# Patient Record
Sex: Female | Born: 1965 | Race: White | Hispanic: No | Marital: Single | Smoking: Current every day smoker
Health system: Southern US, Community
[De-identification: ages and names within clinical notes are randomized; demographics above are authoritative.]

## PROBLEM LIST (undated history)

## (undated) HISTORY — PX: BREAST EXCISIONAL BIOPSY: SUR124

---

## 2008-05-12 ENCOUNTER — Emergency Department (HOSPITAL_BASED_OUTPATIENT_CLINIC_OR_DEPARTMENT_OTHER): Admission: EM | Admit: 2008-05-12 | Discharge: 2008-05-12 | Payer: Self-pay | Admitting: Emergency Medicine

## 2009-01-13 ENCOUNTER — Encounter: Payer: Self-pay | Admitting: Emergency Medicine

## 2009-01-13 ENCOUNTER — Ambulatory Visit: Payer: Self-pay | Admitting: Cardiothoracic Surgery

## 2009-01-13 ENCOUNTER — Ambulatory Visit: Payer: Self-pay | Admitting: Diagnostic Radiology

## 2009-01-13 ENCOUNTER — Inpatient Hospital Stay (HOSPITAL_COMMUNITY): Admission: EM | Admit: 2009-01-13 | Discharge: 2009-01-15 | Payer: Self-pay

## 2009-11-08 ENCOUNTER — Emergency Department (HOSPITAL_BASED_OUTPATIENT_CLINIC_OR_DEPARTMENT_OTHER): Admission: EM | Admit: 2009-11-08 | Discharge: 2009-11-08 | Payer: Self-pay | Admitting: Emergency Medicine

## 2009-11-10 ENCOUNTER — Emergency Department (HOSPITAL_BASED_OUTPATIENT_CLINIC_OR_DEPARTMENT_OTHER): Admission: EM | Admit: 2009-11-10 | Discharge: 2009-11-10 | Payer: Self-pay | Admitting: Emergency Medicine

## 2009-11-10 ENCOUNTER — Ambulatory Visit: Payer: Self-pay | Admitting: Diagnostic Radiology

## 2010-10-30 ENCOUNTER — Ambulatory Visit: Payer: Self-pay | Admitting: Family Medicine

## 2010-12-09 LAB — DIFFERENTIAL
Basophils Absolute: 0 10*3/uL (ref 0.0–0.1)
Basophils Relative: 0 % (ref 0–1)
Eosinophils Absolute: 0.2 10*3/uL (ref 0.0–0.7)
Lymphocytes Relative: 14 % (ref 12–46)
Neutro Abs: 11.4 10*3/uL — ABNORMAL HIGH (ref 1.7–7.7)
Neutrophils Relative %: 76 % (ref 43–77)

## 2010-12-09 LAB — BASIC METABOLIC PANEL
CO2: 31 mEq/L (ref 19–32)
Calcium: 8.3 mg/dL — ABNORMAL LOW (ref 8.4–10.5)
Chloride: 104 mEq/L (ref 96–112)
Creatinine, Ser: 0.7 mg/dL (ref 0.4–1.2)
Glucose, Bld: 92 mg/dL (ref 70–99)
Potassium: 3.7 mEq/L (ref 3.5–5.1)

## 2010-12-09 LAB — CBC
HCT: 23.6 % — ABNORMAL LOW (ref 36.0–46.0)
Hemoglobin: 7.4 g/dL — ABNORMAL LOW (ref 12.0–15.0)
Platelets: 464 10*3/uL — ABNORMAL HIGH (ref 150–400)
RBC: 3.79 MIL/uL — ABNORMAL LOW (ref 3.87–5.11)
RDW: 17.7 % — ABNORMAL HIGH (ref 11.5–15.5)

## 2010-12-30 LAB — IRON AND TIBC
Iron: 12 ug/dL — ABNORMAL LOW (ref 42–135)
Saturation Ratios: 3 % — ABNORMAL LOW (ref 20–55)
TIBC: 429 ug/dL (ref 250–470)
UIBC: 417 ug/dL

## 2010-12-30 LAB — OPIATE, QUANTITATIVE, URINE: Hydrocodone: NEGATIVE ng/mL

## 2010-12-30 LAB — BASIC METABOLIC PANEL
CO2: 24 mEq/L (ref 19–32)
CO2: 24 mEq/L (ref 19–32)
Calcium: 9.4 mg/dL (ref 8.4–10.5)
Chloride: 107 mEq/L (ref 96–112)
Creatinine, Ser: 0.7 mg/dL (ref 0.4–1.2)
Glucose, Bld: 157 mg/dL — ABNORMAL HIGH (ref 70–99)
Potassium: 3.8 mEq/L (ref 3.5–5.1)
Sodium: 136 mEq/L (ref 135–145)
Sodium: 142 mEq/L (ref 135–145)

## 2010-12-30 LAB — CBC
HCT: 25.6 % — ABNORMAL LOW (ref 36.0–46.0)
HCT: 26.1 % — ABNORMAL LOW (ref 36.0–46.0)
Hemoglobin: 8.1 g/dL — ABNORMAL LOW (ref 12.0–15.0)
MCHC: 31.3 g/dL (ref 30.0–36.0)
MCHC: 31.9 g/dL (ref 30.0–36.0)
MCV: 62.7 fL — ABNORMAL LOW (ref 78.0–100.0)
MCV: 62.7 fL — ABNORMAL LOW (ref 78.0–100.0)
Platelets: 437 10*3/uL — ABNORMAL HIGH (ref 150–400)
Platelets: 453 10*3/uL — ABNORMAL HIGH (ref 150–400)
RBC: 4.08 MIL/uL (ref 3.87–5.11)
RDW: 19.5 % — ABNORMAL HIGH (ref 11.5–15.5)
RDW: 19.6 % — ABNORMAL HIGH (ref 11.5–15.5)
WBC: 6.9 10*3/uL (ref 4.0–10.5)

## 2010-12-30 LAB — POCT CARDIAC MARKERS
CKMB, poc: <1 ng/mL — ABNORMAL LOW (ref 1.0–8.0)
Myoglobin, poc: 57.5 ng/mL (ref 12–200)

## 2010-12-30 LAB — RETICULOCYTES
RBC.: 4.22 MIL/uL (ref 3.87–5.11)
Retic Count, Absolute: 54.9 10*3/uL (ref 19.0–186.0)
Retic Ct Pct: 1.3 % (ref 0.4–3.1)

## 2010-12-30 LAB — PREGNANCY, URINE: Preg Test, Ur: NEGATIVE

## 2010-12-30 LAB — LIPID PANEL
HDL: 26 mg/dL — ABNORMAL LOW (ref >39)
LDL Cholesterol: 82 mg/dL (ref 0–99)
Triglycerides: 61 mg/dL (ref <150)

## 2010-12-30 LAB — D-DIMER, QUANTITATIVE: D-Dimer, Quant: 0.73 ug/mL-FEU — ABNORMAL HIGH (ref 0.00–0.48)

## 2010-12-30 LAB — FERRITIN: Ferritin: 6 ng/mL — ABNORMAL LOW (ref 10–291)

## 2010-12-30 LAB — CARDIAC PANEL(CRET KIN+CKTOT+MB+TROPI)
CK, MB: 0.8 ng/mL (ref 0.3–4.0)
Relative Index: 0.5 (ref 0.0–2.5)
Troponin I: <0.01 ng/mL (ref 0.00–0.06)

## 2010-12-30 LAB — FOLATE: Folate: 7.5 ng/mL

## 2010-12-30 LAB — URINE DRUGS OF ABUSE SCREEN W ALC, ROUTINE (REF LAB)
Barbiturate Quant, Ur: NEGATIVE
Benzodiazepines.: NEGATIVE
Marijuana Metabolite: NEGATIVE
Phencyclidine (PCP): NEGATIVE
Propoxyphene: NEGATIVE

## 2010-12-30 LAB — VITAMIN B12: Vitamin B-12: 455 pg/mL (ref 211–911)

## 2011-02-02 NOTE — Consult Note (Signed)
[name], [name]             ACCOUNT NO.:  [id number]   MEDICAL RECORD NO.:  [id number]          PATIENT TYPE:  INP   LOCATION:  2009                         FACILITY:  [facility]   PHYSICIAN:  Sheliah Plane, MD    DATE OF BIRTH:  10-Nov-1965   DATE OF CONSULTATION:  01/14/2009  DATE OF DISCHARGE:                                 CONSULTATION   REQUESTING PHYSICIAN:  Orlene Och, MD   PRIMARY CARE PHYSICIAN:  None established yet.   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   HISTORY OF PRESENT ILLNESS:  The patient is a 45 year old female who is  admitted through the emergency room with complaint of intermittent right  chest flank pain.  Right anterior chest worse with taking a deep breath  and worse with exercise, also associated with shortness of breath.  Denies hemoptysis, nausea, fever, chills, or night sweats.  She denies  any nausea or diaphoresis.  She was seen in the [facility]  Emergency room.  At that time, a CT scan of the chest was done that  showed a left lower lobe sequestration and question of pneumatic changes  in the right lower lobe.  No pulmonary embolus was identified.  The  patient's D-dimers were mildly elevated.  She has been admitted to the  medical service.  Also on admission, hematocrit is noted to be 26 with a  hemoglobin of 8.1.   PAST MEDICAL HISTORY:  1. The patient denies diabetes.  2. Denies hypertension.  3. Denies hyperlipidemia.  4. Does note history of chronic anemia.   SURGERIES:  The only surgery the patient has had is a C-section many  years ago.   She is a chronic smoker up to a pack a day for the past 25 years.   SOCIAL AND FAMILY HISTORY:  The patient's mother is alive, has diabetes  and had bypass surgery.  Father's medical history is unknown, has 1  sister with hypertension.  She currently manages a Health and safety inspector.   MEDICATIONS:  She is on no  medications.   ALLERGIES:  Denies any allergies.   CARDIAC REVIEW OF SYSTEMS:  Positive for chest discomfort on the right  and shortness of breath with exertion.  She denies constitutional  symptoms such as fever, chills, or night sweats.  She denies any blood  in her stool.  Does note that she has been told she is chronic with  anemic, but does not remember any details of this.  When asked about her  last menstrual period, she notes that for the past 2 years she has bled,  had vaginal bleeding almost every day of unknown cause.  All other  review of systems are negative.   PHYSICAL EXAMINATION:  GENERAL:  On exam, the patient is awake, alert,  somewhat simple in her answers, but able to relate her history in good  detail.  VITAL SIGNS:  She is 5 feet 4 inches tall, weighs 309 pounds as recorded  in the emergency room.  NECK:  She has no carotid bruits.  LUNGS:  Clear.  CARDIAC:  Regular rate and rhythm, I do not appreciate any murmur.  ABDOMEN:  Markedly obese, but unable to palpate any organs.  She has +1  DP and PT pulses bilaterally without calf tenderness.  No significant  edema.   LABORATORY FINDINGS:  The patient's hematocrit is 26%, hemoglobin 8.1.  Creatinine is 0.7, CK-MB and troponins were not elevated.  D-dimer 0.73.  CT scan of the chest is reviewed and shows a left lower lobe pulmonary  sequestration with a fairly large vessel coming off the aorta leading it  directly with the cystic area.  There is no evidence of pleural effusion  or evidence of bleed.   IMPRESSION:  The patient admitted through the emergency room with right  chest/pleuritic chest pain of unknown etiology and found incidentally to  have a left lower lobe pulmonary sequestration.  Also, noted to have  significant chronic anemia, with significant vaginal bleeding on an  almost daily basis.  Also, the patient has morbid obesity.   SUGGESTIONS:  I believe the patient's pulmonary sequestration over  the  long term of years, should be resected, so many years down the road it  does not cause further enlargement and compromise of the remaining lung.  Currently, it is not one of her health risk and I would recommend  proceeding aggressively with;  1. Establish a primary medical doctor for management of health risks.  2. Ensure the patient no longer smokes.  3. Rule out cardiac disease as the cause of her chest pain, pulmonary      embolus has been ruled out by CT scan.  4. Evaluate and treat for significant anemia, likely iron deficiency      anemia based on almost daily vaginal bleeding x2 years.  When these      things were done and the patient has lost significant amount of      weight to decrease the risk of any operative procedure or consider      possible resection of the pulmonary sequestration in the next year      or two.  The patient could be referred back when the above health      issues have been resolved.      Sheliah Plane, MD  Electronically Signed     EG/MEDQ  D:  01/14/2009  T:  01/15/2009  Job:  [id number]   cc:   Eduard Clos, MD  Orlene Och, MD

## 2011-02-02 NOTE — H&P (Signed)
NAMEDENETTA, FEI             ACCOUNT NO.:  [id number]   MEDICAL RECORD NO.:  [id number]          PATIENT TYPE:  INP   LOCATION:  2009                         FACILITY:  [facility]   PHYSICIAN:  Eduard Clos, MDDATE OF BIRTH:  [birth year]   DATE OF ADMISSION:  01/13/2009  DATE OF DISCHARGE:                              HISTORY & PHYSICAL   The patient's primary care physician is unassigned.   CHIEF COMPLAINT:  Chest pain.   HISTORY OF PRESENT ILLNESS:  A 45 year old female with a history of  morbid obesity, tobacco abuse, presented with complaint of chest pain  which started off yesterday morning which woke her up from sleep.  Denies any associated dizziness or any palpitations, nausea or  diaphoresis.  The patient's chest pain is stabbing in nature, comes off  and on multiple times in an hour.  The patient's pain is increased by  movement but not by exertion.  Does have localized tenderness on  palpation.  In the ER at [facility] the patient had a CT chest with a  mildly elevated D-dimer which showed no evidence of pulmonary embolism,  but did show a left lower lobe lung mass consistent with intralobar  pulmonary sequestration.  Per ER physician,  was noted and they are  willing to follow in the hospital.   The patient denies any nausea, vomiting, diarrhea, fever, chills,  headache, dizziness, loss of consciousness, has been admitted for  further observation for her chest pain.   PAST MEDICAL HISTORY:  Nothing significant.   PAST SURGICAL HISTORY:  Cesarean section and tubal ligation.   MEDICATIONS:  Prior to admission none.   FAMILY HISTORY:  On significant for leukemia in her cousin.   SOCIAL HISTORY:  The patient lives with her family and smokes  cigarettes.  Has been advised to quit smoking.  Denies any alcohol or  drug abuse.   REVIEW OF SYSTEMS:  As per history of present illness.  Nothing else  significant.   PHYSICAL EXAMINATION:  Patient seen at  bedside.  VITAL SIGNS:  Blood pressure is 120/60, pulse 70/min, temperature 97.8,  respirations 18/min, O2 sat 100% on room air.  HEENT:  Anicteric.  No pallor.  CHEST:  Bilateral air entry present.  No rhonchi on auscultation.  There  is localized tenderness at the superior aspect of her sternum.  HEART:  S1, S2 heard.  ABDOMEN:  Soft, nontender, bowel sounds heard.  CNS:  Awake, alert and oriented to person, place and time.  Moves upper  and lower extremities 5/5.  EXTREMITIES:  Peripheral pulses felt.  No edema.   LABORATORIES:  EKG:  Normal sinus rhythm with no acute ST-T changes.  CT  of chest:  No evidence of acute pulmonary embolism, small left lower  lobe mass with  arterial supply from  thoracic aorta and surround  emphysematous changes most consistent with an intralobar pulmonary  sequestration.  Chest x-ray shows mild cardiomegaly without congestive  heart failure, possible evolving right lower lobe pneumonia - this may  be artifact of  inspiration.  D-dimer 0.73.  Sodium 142, potassium 3.6,  chloride  105, carbon dioxide 24, glucose 106, BUN 10, creatinine 0.7,  calcium 9.4.  Troponin I less than 0.05.  CK-MB less than 1.   ASSESSMENT:  1. Chest pain, atypical, rule out acute coronary syndrome.  2. 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  3. Tobacco abuse.   PLAN:  Will admit patient to telemetry, cycle cardiac markers and place  SCDs for atypical chest pain along with nitroglycerin and aspirin.  A 2D  echocardiogram.  Will get a cardiothoracic study consult for her  sequestration.  further recommendations  patient strongly advised to  quit smoking.      Eduard Clos, MD  Electronically Signed     ANK/MEDQ  D:  01/13/2009  T:  01/13/2009  Job:  [id number]

## 2011-02-05 NOTE — Discharge Summary (Signed)
[name], DICK             ACCOUNT NO.:  [id number]   MEDICAL RECORD NO.:  [id number]          PATIENT TYPE:  INP   LOCATION:  2009                         FACILITY:  [facility]   PHYSICIAN:  Hind I Elsaid, MD      DATE OF BIRTH:  1966-08-10   DATE OF ADMISSION:  01/13/2009  DATE OF DISCHARGE:  01/15/2009                               DISCHARGE SUMMARY   DISCHARGE DIAGNOSES:  1. Atypical chest pain, myocardial infarction was ruled out.  2. Iron-deficiency anemia, the patient refused any blood transfusion.  3. Obesity.  4. 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  5. Menorrhagia.  6. Tobacco abuse.   DISCHARGE MEDICATIONS:  1. Nicotine patch.  2. Ferrous sulfate 325 mg p.o. daily.  3. Folic acid 1 mg p.o. daily.   CONSULTATION:  Thoracic Surgery for sequestration.   PROCEDURES:  1. CT angiogram, negative for pulmonary embolism, and there is a small      intralobar pulmonary sequestration.  2. Chest x-ray, mild cardiomegaly without congestive heart failure,      possibility of right lower lobe pneumonia.   HISTORY OF PRESENT ILLNESS:  1. Please review the history done by Dr. Drusilla Kanner.  The patient is      a 45 year old female who was admitted with chest pain, had a CT      chest secondary to mildly elevated D-dimer, which did not show any      evidence of pulmonary embolism but did show left lower lobe lung      mass consistent with intralobar pulmonary sequestration.  The      patient admitted to telemetry floor, cycling cardiac enzymes, which      were negative.  Lipid profile was nonsignificant and TSH of 1.397.      The patient has low risk for coronary artery disease.  The only      consideration will be smoking history.  A 2D echo can be done at      the patient's primary care physician's office, and during hospital      stay, chest pain resolved and no more shortness of breath.  Tobacco      counseling was done during hospital  stay.  Secondary to the above      CT angiogram finding of sequestration, thoracic surgeon was      consulted.  There is no indication for emergency resection of her      pulmonary sequestration and recommend followup, another CT scan      within 1-2 years.  2. Anemia.  The patient noted to have iron-deficiency anemia with low      ferritin felt to be secondary to menorrhagia, and the patient      refused any blood transfusion, and ferrous sulfate and folic acid      were prescribed.  The patient was advised to follow up with her      primary care physician.  3. Smoking and obesity.  Smoking counseling and weight loss counseling      done during hospital stay.  The patient was  advised to follow up      with her primary care physician next week.   At this time, we felt the patient is medically stable to be discharged  home.      Hind Bosie Helper, MD  Electronically Signed     HIE/MEDQ  D:  03/19/2009  T:  03/20/2009  Job:  [id number]

## 2011-11-14 ENCOUNTER — Emergency Department (INDEPENDENT_AMBULATORY_CARE_PROVIDER_SITE_OTHER): Payer: PRIVATE HEALTH INSURANCE

## 2011-11-14 ENCOUNTER — Encounter (HOSPITAL_BASED_OUTPATIENT_CLINIC_OR_DEPARTMENT_OTHER): Payer: Self-pay | Admitting: Emergency Medicine

## 2011-11-14 ENCOUNTER — Emergency Department (HOSPITAL_BASED_OUTPATIENT_CLINIC_OR_DEPARTMENT_OTHER)
Admission: EM | Admit: 2011-11-14 | Discharge: 2011-11-14 | Disposition: A | Discharge status: Home Health | Payer: PRIVATE HEALTH INSURANCE | Attending: Emergency Medicine | Admitting: Emergency Medicine

## 2011-11-14 DIAGNOSIS — M25569 Pain in unspecified knee: Secondary | ICD-10-CM

## 2011-11-14 DIAGNOSIS — F17200 Nicotine dependence, unspecified, uncomplicated: Secondary | ICD-10-CM | POA: Insufficient documentation

## 2011-11-14 DIAGNOSIS — M25469 Effusion, unspecified knee: Secondary | ICD-10-CM

## 2011-11-14 MED ORDER — HYDROCODONE-ACETAMINOPHEN 5-500 MG PO TABS: 1.0000 | ORAL_TABLET | Freq: Four times a day (QID) | ORAL | Status: AC | PRN

## 2011-11-14 NOTE — ED Provider Notes (Signed)
History     CSN: [id number]  Arrival date & time 11/14/11  1311   First MD Initiated Contact with Patient 11/14/11 1418      Chief Complaint  Patient presents with  . Knee Pain    (Consider location/radiation/quality/duration/timing/severity/associated sxs/prior treatment) HPI Comments: Pt state that she is having left knee pain and swelling for the last 2 weeks:pt denies known injury,but states that she is on her feet all the time  Patient is a 46 y.o. female presenting with knee pain. The history is provided by the patient. No language interpreter was used.  Knee Pain This is a new problem. The current episode started 1 to 4 weeks ago. The problem occurs constantly. The problem has been unchanged. The symptoms are aggravated by bending. She has tried nothing for the symptoms.    History reviewed. No pertinent past medical history.  History reviewed. No pertinent past surgical history.  No family history on file.  History  Substance Use Topics  . Smoking status: Current Everyday Smoker  . Smokeless tobacco: Not on file  . Alcohol Use: No    OB History    Grav Para Term Preterm Abortions TAB SAB Ect Mult Living                  Review of Systems  All other systems reviewed and are negative.    Allergies  Review of patient's allergies indicates no known allergies.  Home Medications  No current outpatient prescriptions on file.  BP 158/77  Pulse 109  Temp(Src) 98.1 F (36.7 C) (Oral)  Resp 20  Ht 5\' 3"  (1.6 m)  Wt 290 lb (131.543 kg)  BMI 51.37 kg/m2  SpO2 98%  LMP 11/14/2011  Physical Exam  Nursing note and vitals reviewed. Constitutional: She is oriented to person, place, and time. She appears well-developed and well-nourished.  Cardiovascular: Normal rate and regular rhythm.   Pulmonary/Chest: Effort normal and breath sounds normal.  Musculoskeletal: Normal range of motion.       Mild swelling noted to the left knee:full NWG:NFAOZH intact:no  redness or warmth noted to the area  Neurological: She is alert and oriented to person, place, and time.  Skin: Skin is warm.  Psychiatric: She has a normal mood and affect.    ED Course  Procedures (including critical care time)  Labs Reviewed - No data to display Dg Knee 2 Views Left  11/14/2011  *RADIOLOGY REPORT*  Clinical Data: Knee pain  LEFT KNEE - 1-2 VIEW  Comparison: None.  Findings: No acute fracture and no dislocation.  Moderate joint effusion.  IMPRESSION: No acute bony pathology.  Joint effusion.  Original Report Authenticated By: Donavan Burnet, M.D.     1. Knee effusion       MDM  Pt placed in knee immobilizer:will treat symptomatically:pt is acting for a work note        Teressa Lower, NP 11/14/11 1447  Teressa Lower, NP 11/14/11 1447

## 2011-11-14 NOTE — ED Notes (Signed)
Pt c/o LT knee pain x 2 weeks; no known injury

## 2011-11-14 NOTE — Discharge Instructions (Signed)
Knee Effusion °The medical term for having fluid in your knee is effusion. This is often due to an internal derangement of the knee. This means something is wrong inside the knee. Some of the causes of fluid in the knee may be torn cartilage, a torn ligament, or bleeding into the joint from an injury. Your knee is likely more difficult to bend and move. This is often because there is increased pain and pressure in the joint. The time it takes for recovery from a knee effusion depends on different factors, including:  °· Type of injury.  °· Your age.  °· Physical and medical conditions.  °· Rehabilitation Strategies.  °How long you will be away from your normal activities will depend on what kind of knee problem you have and how much damage is present. Your knee has two types of cartilage. Articular cartilage covers the bone ends and lets your knee bend and move smoothly. Two menisci, thick pads of cartilage that form a rim inside the joint, help absorb shock and stabilize your knee. Ligaments bind the bones together and support your knee joint. Muscles move the joint, help support your knee, and take stress off the joint itself. °CAUSES  °Often an effusion in the knee is caused by an injury to one of the menisci. This is often a tear in the cartilage. Recovery after a meniscus injury depends on how much meniscus is damaged and whether you have damaged other knee tissue. Small tears may heal on their own with conservative treatment. Conservative means rest, limited weight bearing activity and muscle strengthening exercises. Your recovery may take up to 6 weeks.  °TREATMENT  °Larger tears may require surgery. Meniscus injuries may be treated during arthroscopy. Arthroscopy is a procedure in which your surgeon uses a small telescope like instrument to look in your knee. Your caregiver can make a more accurate diagnosis (learning what is wrong) by performing an arthroscopic procedure. °If your injury is on the inner  margin of the meniscus, your surgeon may trim the meniscus back to a smooth rim. In other cases your surgeon will try to repair a damaged meniscus with stitches (sutures). This may make rehabilitation take longer, but may provide better long term result by helping your knee keep its shock absorption capabilities. °Ligaments which are completely torn usually require surgery for repair. °HOME CARE INSTRUCTIONS °· Use crutches as instructed.  °· If a brace is applied, use as directed.  °· Once you are home, an ice pack applied to your swollen knee may help with discomfort and help decrease swelling.  °· Keep your knee raised (elevated) when you are not up and around or on crutches.  °· Only take over-the-counter or prescription medicines for pain, discomfort, or fever as directed by your caregiver.  °· Your caregivers will help with instructions for rehabilitation of your knee. This often includes strengthening exercises.  °· You may resume a normal diet and activities as directed.  °SEEK MEDICAL CARE IF:  °· There is increased swelling in your knee.  °· You notice redness, swelling, or increasing pain in your knee.  °· An unexplained oral temperature above 102° F (38.9° C) develops.  °SEEK IMMEDIATE MEDICAL CARE IF:  °· You develop a rash.  °· You have difficulty breathing.  °· You have any allergic reactions from medications you may have been given.  °· There is severe pain with any motion of the knee.  °MAKE SURE YOU:  °· Understand these instructions.  °·   Will watch your condition.  °· Will get help right away if you are not doing well or get worse.  °Document Released: 11/27/2003 Document Revised: 05/19/2011 Document Reviewed: 01/31/2008 °[employer]® Patient Information ©2012 [employer]. °

## 2011-11-14 NOTE — ED Provider Notes (Signed)
Medical screening examination/treatment/procedure(s) were performed by non-physician practitioner and as supervising physician I was immediately available for consultation/collaboration.  Ethelda Chick, MD 11/14/11 [phone]

## 2012-02-18 ENCOUNTER — Other Ambulatory Visit: Payer: Self-pay | Admitting: Family Medicine

## 2012-02-18 DIAGNOSIS — Z1231 Encounter for screening mammogram for malignant neoplasm of breast: Secondary | ICD-10-CM

## 2012-04-12 ENCOUNTER — Ambulatory Visit: Payer: PRIVATE HEALTH INSURANCE

## 2015-05-15 ENCOUNTER — Ambulatory Visit
Admission: RE | Admit: 2015-05-15 | Discharge: 2015-05-15 | Disposition: A | Discharge status: Home / Self Care | Payer: BLUE CROSS/BLUE SHIELD | Source: Ambulatory Visit | Attending: Family Medicine | Admitting: Family Medicine

## 2015-05-15 ENCOUNTER — Other Ambulatory Visit: Payer: Self-pay | Admitting: Family Medicine

## 2015-05-15 DIAGNOSIS — M5432 Sciatica, left side: Secondary | ICD-10-CM

## 2015-05-15 DIAGNOSIS — M25552 Pain in left hip: Secondary | ICD-10-CM

## 2016-12-13 ENCOUNTER — Encounter: Payer: Self-pay | Admitting: Internal Medicine

## 2016-12-22 ENCOUNTER — Other Ambulatory Visit: Payer: Self-pay | Admitting: Family Medicine

## 2016-12-22 ENCOUNTER — Other Ambulatory Visit (HOSPITAL_COMMUNITY)
Admission: RE | Admit: 2016-12-22 | Discharge: 2016-12-22 | Disposition: A | Discharge status: Home / Self Care | Payer: 59 | Source: Ambulatory Visit | Attending: Family Medicine | Admitting: Family Medicine

## 2016-12-22 DIAGNOSIS — Z124 Encounter for screening for malignant neoplasm of cervix: Secondary | ICD-10-CM | POA: Insufficient documentation

## 2016-12-27 LAB — CYTOLOGY - PAP
Adequacy: ABSENT
DIAGNOSIS: NEGATIVE
HPV (WINDOPATH): NOT DETECTED

## 2018-01-31 ENCOUNTER — Emergency Department (HOSPITAL_COMMUNITY)
Admission: EM | Admit: 2018-01-31 | Discharge: 2018-01-31 | Disposition: A | Discharge status: Home / Self Care | Payer: 59 | Attending: Emergency Medicine | Admitting: Emergency Medicine

## 2018-01-31 ENCOUNTER — Encounter (HOSPITAL_COMMUNITY): Payer: Self-pay | Admitting: Emergency Medicine

## 2018-01-31 ENCOUNTER — Emergency Department (HOSPITAL_COMMUNITY): Payer: 59

## 2018-01-31 DIAGNOSIS — E119 Type 2 diabetes mellitus without complications: Secondary | ICD-10-CM | POA: Diagnosis not present

## 2018-01-31 DIAGNOSIS — F17200 Nicotine dependence, unspecified, uncomplicated: Secondary | ICD-10-CM | POA: Insufficient documentation

## 2018-01-31 DIAGNOSIS — I10 Essential (primary) hypertension: Secondary | ICD-10-CM | POA: Insufficient documentation

## 2018-01-31 DIAGNOSIS — R55 Syncope and collapse: Secondary | ICD-10-CM | POA: Insufficient documentation

## 2018-01-31 LAB — BASIC METABOLIC PANEL
Anion gap: 11 (ref 5–15)
BUN: 12 mg/dL (ref 6–20)
CALCIUM: 9.3 mg/dL (ref 8.9–10.3)
CO2: 24 mmol/L (ref 22–32)
Chloride: 102 mmol/L (ref 101–111)
Creatinine, Ser: 0.63 mg/dL (ref 0.44–1.00)
GFR calc Af Amer: >60 mL/min (ref >60)
GLUCOSE: 308 mg/dL — AB (ref 65–99)
Potassium: 4.1 mmol/L (ref 3.5–5.1)
SODIUM: 137 mmol/L (ref 135–145)

## 2018-01-31 LAB — I-STAT BETA HCG BLOOD, ED (MC, WL, AP ONLY): I-stat hCG, quantitative: <5 m[IU]/mL (ref <5)

## 2018-01-31 LAB — CBC
HCT: 42.5 % (ref 36.0–46.0)
HEMOGLOBIN: 13.3 g/dL (ref 12.0–15.0)
MCH: 25 pg — ABNORMAL LOW (ref 26.0–34.0)
MCHC: 31.3 g/dL (ref 30.0–36.0)
MCV: 79.9 fL (ref 78.0–100.0)
Platelets: 464 10*3/uL — ABNORMAL HIGH (ref 150–400)
RBC: 5.32 MIL/uL — ABNORMAL HIGH (ref 3.87–5.11)
RDW: 14.1 % (ref 11.5–15.5)
WBC: 8.6 10*3/uL (ref 4.0–10.5)

## 2018-01-31 LAB — I-STAT TROPONIN, ED
TROPONIN I, POC: 0 ng/mL (ref 0.00–0.08)
Troponin i, poc: 0.01 ng/mL (ref 0.00–0.08)

## 2018-01-31 LAB — D-DIMER, QUANTITATIVE (NOT AT ARMC): D DIMER QUANT: 0.46 ug{FEU}/mL (ref 0.00–0.50)

## 2018-01-31 NOTE — ED Triage Notes (Signed)
Pt to ER for evaluation of left chest pressure onset this evening after walking outside, reports when she came back inside she had a syncopal event. Pt reports cardiac hx in the past. Pt was sent from [place] walk in [facility]. Pt in NAD at this time.

## 2018-01-31 NOTE — ED Provider Notes (Signed)
[facility] EMERGENCY DEPARTMENT Provider Note   CSN: [id number] Arrival date & time: 01/31/18  1808     History   Chief Complaint Chief Complaint  Patient presents with  . Loss of Consciousness  . Chest Pain    HPI [name] is a 52 y.o. female.  HPI  Patient is a 52 year old female the past medical history of hypertension and diabetes which has not been controlled for the past year as patient's glucometer broke.  Patient comes in today following a near syncopal event.  Patient was at work walking when she became diaphoretic nauseated and felt like she was going to "pass out."  Patient denies any head trauma.  Patient states her symptoms have resolved at this time.  Patient states that she has bilateral leg pain but states this is her baseline.  Patient states pain is made worse by deep breathing.  Patient denies any recent illness, fevers, chills, emesis.  Patient denies dysuria.  Patient denies radiation of chest pain or shortness of breath.  History reviewed. No pertinent past medical history.  There are no active problems to display for this patient.   History reviewed. No pertinent surgical history.   OB History   None      Home Medications    Prior to Admission medications   Not on File    Family History History reviewed. No pertinent family history.  Social History Social History   Tobacco Use  . Smoking status: Current Every Day Smoker  . Smokeless tobacco: Never Used  Substance Use Topics  . Alcohol use: No  . Drug use: No     Allergies   Chantix [varenicline] and Metformin and related   Review of Systems Review of Systems  Constitutional: Negative for chills and fever.  Respiratory: Negative for shortness of breath.   Cardiovascular: Positive for chest pain. Negative for leg swelling.  Gastrointestinal: Negative for abdominal pain.  Neurological: Positive for light-headedness. Negative for seizures, facial  asymmetry, speech difficulty and numbness.  All other systems reviewed and are negative.    Physical Exam Updated Vital Signs BP (!) 137/58   Pulse 73   Temp 98.4 F (36.9 C) (Oral)   Resp (!) 23   SpO2 98%   Physical Exam  Constitutional: She appears well-developed and well-nourished. No distress.  HENT:  Head: Normocephalic and atraumatic.  Eyes: Conjunctivae are normal.  Neck: Neck supple.  Cardiovascular: Normal rate, regular rhythm, intact distal pulses and normal pulses.  No murmur heard. Pulmonary/Chest: Effort normal and breath sounds normal. No respiratory distress.  Abdominal: Soft. There is no tenderness.  Musculoskeletal: She exhibits no edema.       Right lower leg: She exhibits tenderness. She exhibits no edema.       Left lower leg: She exhibits tenderness. She exhibits no edema.  Neurological: She is alert.  Skin: Skin is warm and dry.  Psychiatric: She has a normal mood and affect.  Nursing note and vitals reviewed.    ED Treatments / Results  Labs (all labs ordered are listed, but only abnormal results are displayed) Labs Reviewed  BASIC METABOLIC PANEL - Abnormal; Notable for the following components:      Result Value   Glucose, Bld 308 (*)    All other components within normal limits  CBC - Abnormal; Notable for the following components:   RBC 5.32 (*)    MCH 25.0 (*)    Platelets 464 (*)    All other components  within normal limits  D-DIMER, QUANTITATIVE (NOT AT [facility])  I-STAT TROPONIN, ED  I-STAT BETA HCG BLOOD, ED ([facility], [facility], [facility] ONLY)  I-STAT TROPONIN, ED    EKG EKG Interpretation  Date/Time:  Tuesday Jan 31 2018 18:14:07 EDT Ventricular Rate:  99 PR Interval:  162 QRS Duration: 78 QT Interval:  356 QTC Calculation: 456 R Axis:   78 Text Interpretation:  Normal sinus rhythm Right atrial enlargement Borderline ECG increased rate from prior 4/10 Confirmed by Meridee Score [phone]) on 01/31/2018 7:11:09 PM   Radiology Dg Chest 2  View  Result Date: 01/31/2018 CLINICAL DATA:  LEFT chest pressure, syncopal event. EXAM: CHEST - 2 VIEW COMPARISON:  Chest x-ray dated 01/13/2009. FINDINGS: Heart size and mediastinal contours are normal. Mild central pulmonary vascular congestion. No overt alveolar pulmonary edema. No pleural effusion or pneumothorax seen. Osseous structures about the chest are unremarkable. IMPRESSION: Mild central pulmonary vascular congestion. No overt alveolar pulmonary edema. No evidence of pneumonia. Electronically Signed   By: Bary Richard M.D.   On: 01/31/2018 18:54    Procedures Procedures (including critical care time)  Medications Ordered in ED Medications - No data to display   Initial Impression / Assessment and Plan / ED Course  I have reviewed the triage vital signs and the nursing notes.  Pertinent labs & imaging results that were available during my care of the patient were reviewed by me and considered in my medical decision making (see chart for details).  Clinical Course as of Feb 02 3  Tue Jan 31, 2018  [id number] 52 year old female complaining of a few weeks of on and off chest pain and shortness of breath.  Today while walking she had a stronger pain and possibly passed out versus collapsed to the ground.  She is feeling improved on arrival here and looking very comfortable.  She states she said no prior history of cardiac issues and no prior history of PEs.  She is got an unremarkable exam right now.  She is getting labs EKG chest x-ray   [MB]    Clinical Course User Index [MB] Terrilee Files, MD      Patient's laboratory work-up largely within normal limits.  Patient with normal O2 sat and is not tachycardic.  On physical exam patient had tenderness of bilateral lower extremities and it is difficult to appreciate if there is any swelling given the large legs at baseline.  D-dimer was negative.  Troponin within normal limits.  Do not believe second troponin to be necessary given  timeline events this patient symptoms occurred approximately 7 hours prior to arrival. Pt given appropriate f/u and return precautions. Pt voiced understanding and is agreeable to discharge at this time.    Final Clinical Impressions(s) / ED Diagnoses   Final diagnoses:  Syncope, unspecified syncope type    ED Discharge Orders    None       Caren Griffins, MD 02/01/18 0004    Terrilee Files, MD 02/03/18 1058

## 2018-02-17 ENCOUNTER — Other Ambulatory Visit: Payer: Self-pay | Admitting: Family Medicine

## 2018-02-17 DIAGNOSIS — N6459 Other signs and symptoms in breast: Secondary | ICD-10-CM

## 2018-02-27 ENCOUNTER — Other Ambulatory Visit: Payer: Self-pay | Admitting: Family Medicine

## 2018-02-27 DIAGNOSIS — Z1231 Encounter for screening mammogram for malignant neoplasm of breast: Secondary | ICD-10-CM

## 2018-02-28 ENCOUNTER — Other Ambulatory Visit: Payer: 59

## 2018-03-15 ENCOUNTER — Ambulatory Visit
Admission: RE | Admit: 2018-03-15 | Discharge: 2018-03-15 | Disposition: A | Discharge status: Home / Self Care | Payer: 59 | Source: Ambulatory Visit | Attending: Family Medicine | Admitting: Family Medicine

## 2018-03-15 DIAGNOSIS — Z1231 Encounter for screening mammogram for malignant neoplasm of breast: Secondary | ICD-10-CM

## 2018-03-16 ENCOUNTER — Other Ambulatory Visit: Payer: Self-pay | Admitting: Family Medicine

## 2018-03-16 DIAGNOSIS — R928 Other abnormal and inconclusive findings on diagnostic imaging of breast: Secondary | ICD-10-CM

## 2018-03-27 DIAGNOSIS — R928 Other abnormal and inconclusive findings on diagnostic imaging of breast: Secondary | ICD-10-CM | POA: Diagnosis not present

## 2018-03-27 DIAGNOSIS — E1140 Type 2 diabetes mellitus with diabetic neuropathy, unspecified: Secondary | ICD-10-CM | POA: Diagnosis not present

## 2018-03-27 DIAGNOSIS — I10 Essential (primary) hypertension: Secondary | ICD-10-CM | POA: Diagnosis not present

## 2018-03-27 DIAGNOSIS — Z720 Tobacco use: Secondary | ICD-10-CM | POA: Diagnosis not present

## 2018-03-27 DIAGNOSIS — Z794 Long term (current) use of insulin: Secondary | ICD-10-CM | POA: Diagnosis not present

## 2018-03-31 DIAGNOSIS — H524 Presbyopia: Secondary | ICD-10-CM | POA: Diagnosis not present

## 2018-03-31 DIAGNOSIS — E119 Type 2 diabetes mellitus without complications: Secondary | ICD-10-CM | POA: Diagnosis not present

## 2018-03-31 DIAGNOSIS — H5213 Myopia, bilateral: Secondary | ICD-10-CM | POA: Diagnosis not present

## 2018-03-31 DIAGNOSIS — H35033 Hypertensive retinopathy, bilateral: Secondary | ICD-10-CM | POA: Diagnosis not present

## 2018-03-31 DIAGNOSIS — H52223 Regular astigmatism, bilateral: Secondary | ICD-10-CM | POA: Diagnosis not present

## 2018-03-31 DIAGNOSIS — H2513 Age-related nuclear cataract, bilateral: Secondary | ICD-10-CM | POA: Diagnosis not present

## 2018-03-31 DIAGNOSIS — H40023 Open angle with borderline findings, high risk, bilateral: Secondary | ICD-10-CM | POA: Diagnosis not present

## 2018-05-23 DIAGNOSIS — R928 Other abnormal and inconclusive findings on diagnostic imaging of breast: Secondary | ICD-10-CM | POA: Diagnosis not present

## 2018-05-30 DIAGNOSIS — Z794 Long term (current) use of insulin: Secondary | ICD-10-CM | POA: Diagnosis not present

## 2018-05-30 DIAGNOSIS — I10 Essential (primary) hypertension: Secondary | ICD-10-CM | POA: Diagnosis not present

## 2018-05-30 DIAGNOSIS — E1140 Type 2 diabetes mellitus with diabetic neuropathy, unspecified: Secondary | ICD-10-CM | POA: Diagnosis not present

## 2018-05-31 ENCOUNTER — Ambulatory Visit
Admission: RE | Admit: 2018-05-31 | Discharge: 2018-05-31 | Disposition: A | Discharge status: Home / Self Care | Payer: 59 | Source: Ambulatory Visit | Attending: Family Medicine | Admitting: Family Medicine

## 2018-05-31 DIAGNOSIS — R922 Inconclusive mammogram: Secondary | ICD-10-CM | POA: Diagnosis not present

## 2018-05-31 DIAGNOSIS — R928 Other abnormal and inconclusive findings on diagnostic imaging of breast: Secondary | ICD-10-CM

## 2018-05-31 DIAGNOSIS — N6320 Unspecified lump in the left breast, unspecified quadrant: Secondary | ICD-10-CM | POA: Diagnosis not present

## 2018-08-16 IMAGING — MG DIGITAL SCREENING BILATERAL MAMMOGRAM WITH TOMO AND CAD
6 of 12 series · 6 of 36 positions shown · non-contrast
Comparison: None

CLINICAL DATA: Screening.

EXAM:
DIGITAL SCREENING BILATERAL MAMMOGRAM WITH TOMO AND CAD

[L MLO synth-2D (1 of 2)]
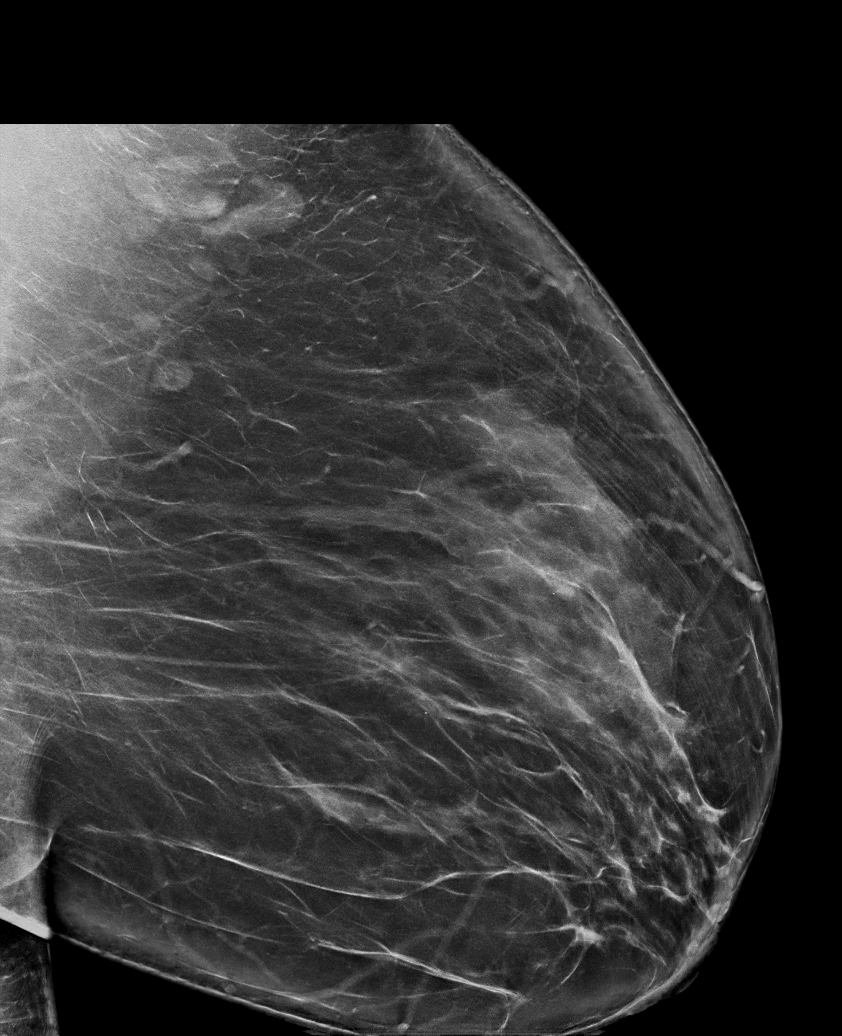

[R MLO synth-2D (1 of 2)]
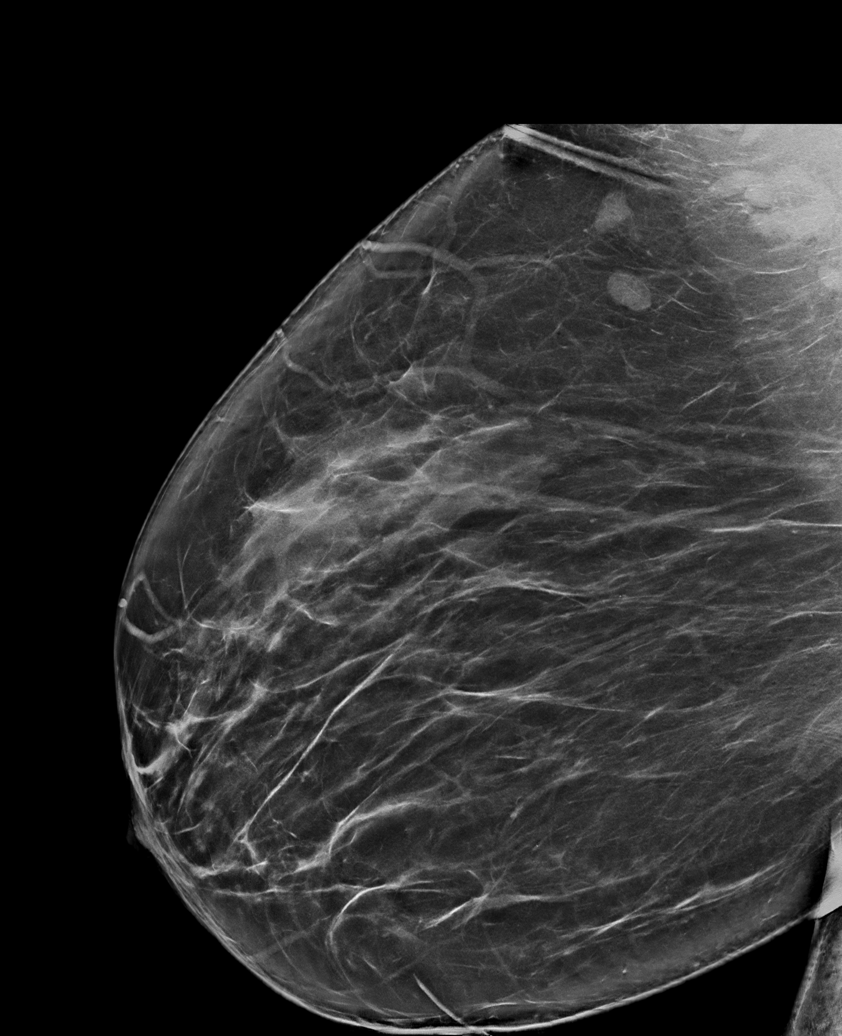

[L CC synth-2D]
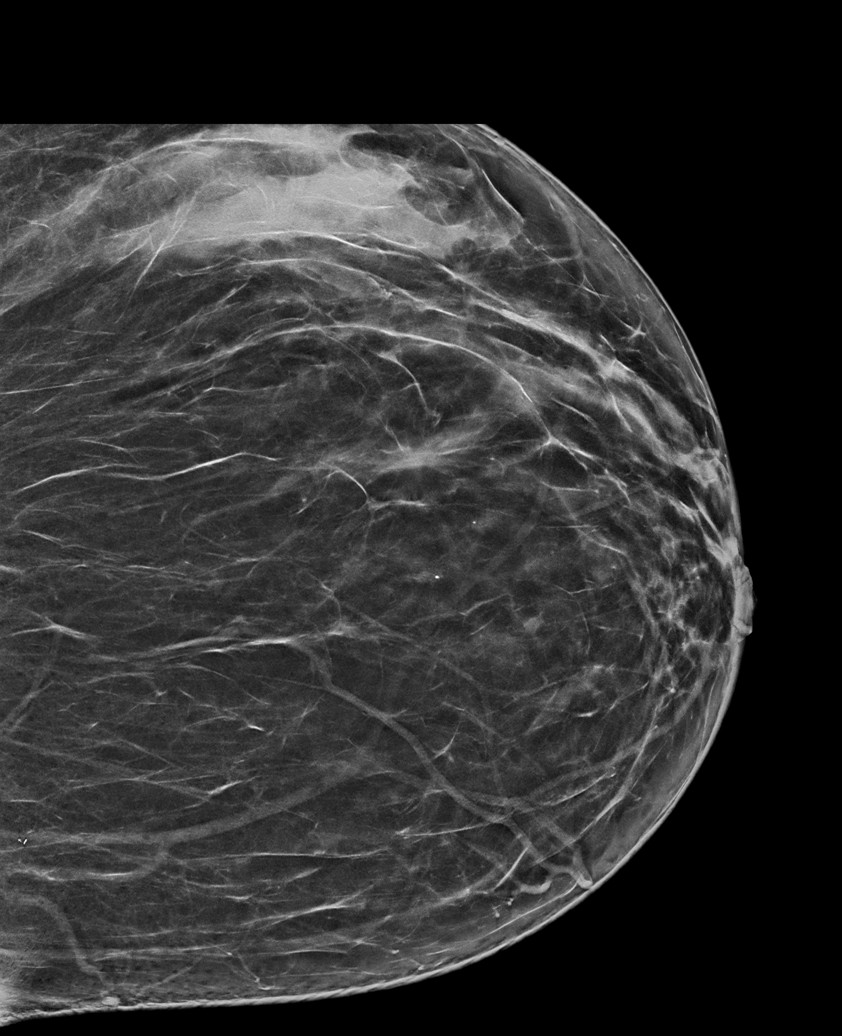

[L MLO synth-2D (2 of 2)]
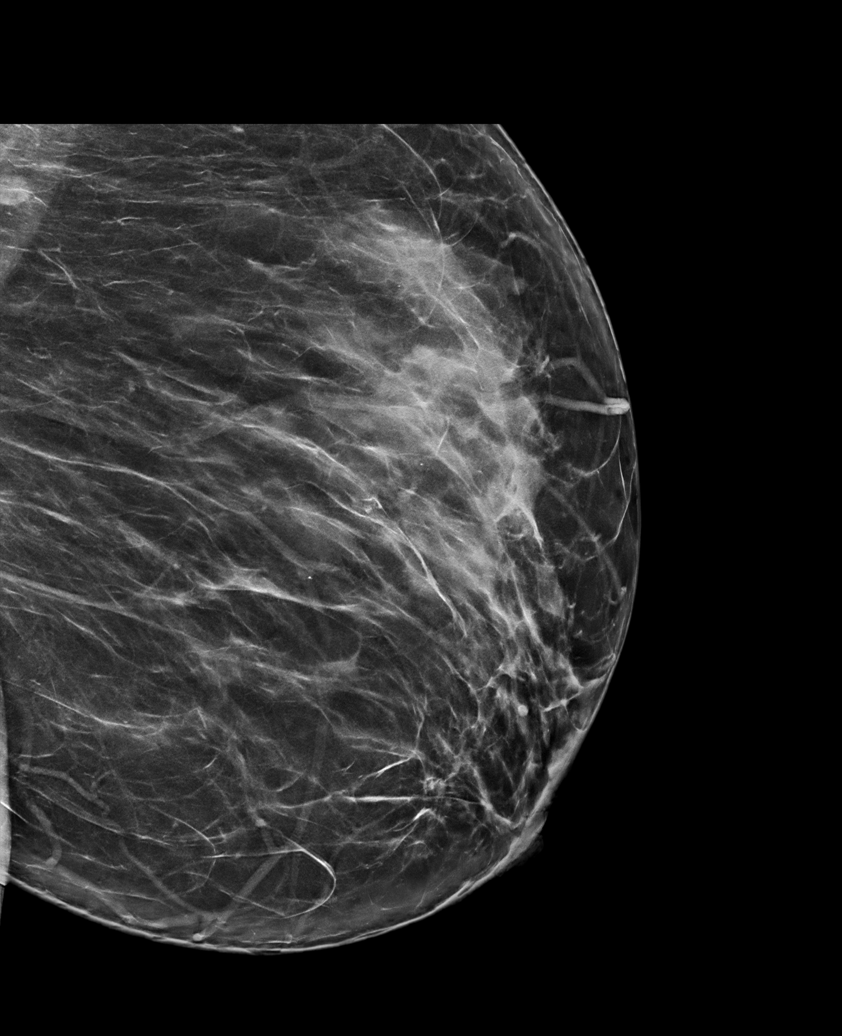

[R CC synth-2D]
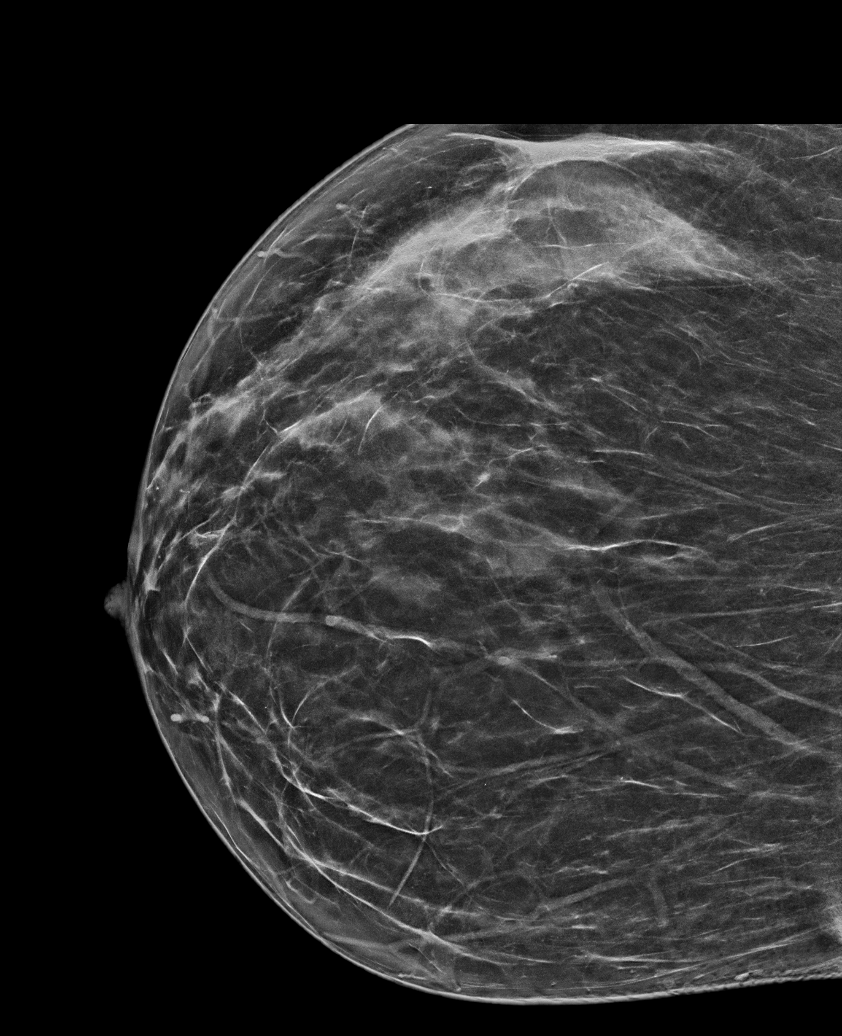

[R MLO synth-2D (2 of 2)]
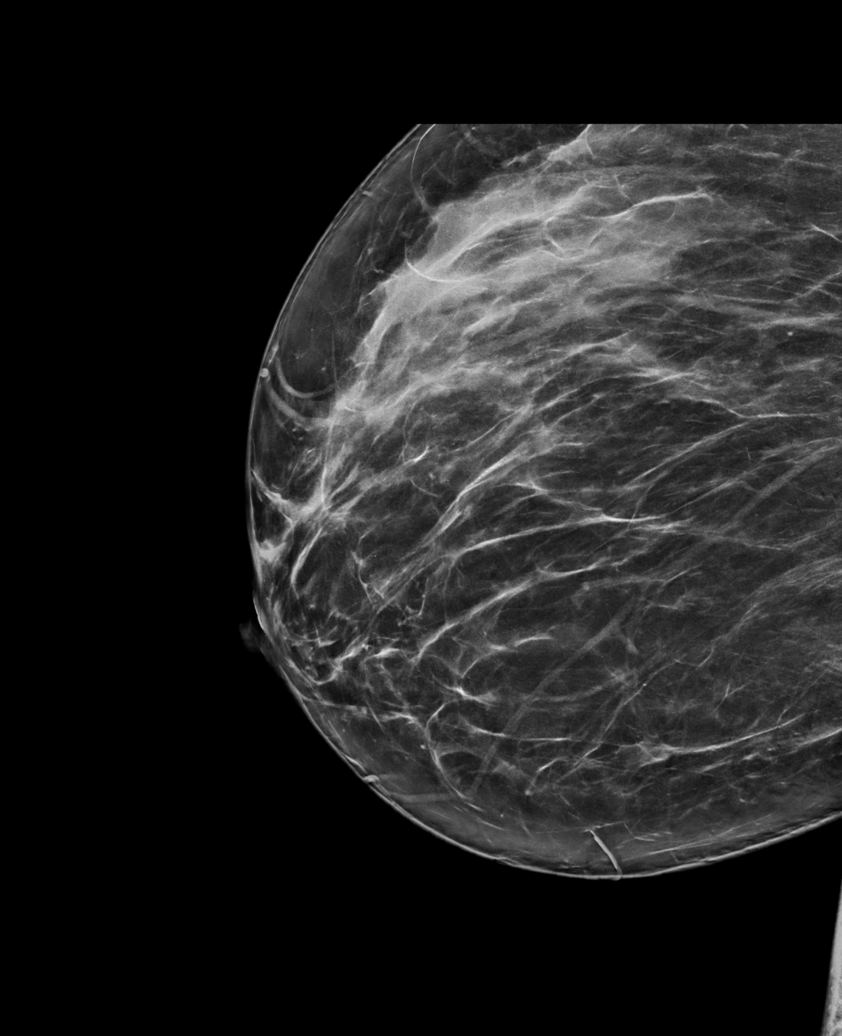

[6 of 36 positions shown; findings below may reference images not displayed]

ACR Breast Density Category b: There are scattered areas of
fibroglandular density.
FINDINGS: In the left breast, 2 possible masses warrant further evaluation. In
the right breast, no findings suspicious for malignancy.

Images were processed with CAD.
IMPRESSION: Further evaluation is suggested for 2 possible masses in the left
breast.

RECOMMENDATION:
Diagnostic mammogram and possibly ultrasound of the left breast.
(Code:2A-6-SSC)

The patient will be contacted regarding the findings, and additional
imaging will be scheduled.

BI-RADS CATEGORY  0: Incomplete. Need additional imaging evaluation
and/or prior mammograms for comparison.

## 2018-08-30 DIAGNOSIS — Z794 Long term (current) use of insulin: Secondary | ICD-10-CM | POA: Diagnosis not present

## 2018-08-30 DIAGNOSIS — Z0000 Encounter for general adult medical examination without abnormal findings: Secondary | ICD-10-CM | POA: Diagnosis not present

## 2018-08-30 DIAGNOSIS — E1140 Type 2 diabetes mellitus with diabetic neuropathy, unspecified: Secondary | ICD-10-CM | POA: Diagnosis not present

## 2019-11-14 DIAGNOSIS — E1140 Type 2 diabetes mellitus with diabetic neuropathy, unspecified: Secondary | ICD-10-CM | POA: Diagnosis not present

## 2019-11-14 DIAGNOSIS — Z720 Tobacco use: Secondary | ICD-10-CM | POA: Diagnosis not present

## 2019-11-14 DIAGNOSIS — I10 Essential (primary) hypertension: Secondary | ICD-10-CM | POA: Diagnosis not present

## 2019-11-14 DIAGNOSIS — R69 Illness, unspecified: Secondary | ICD-10-CM | POA: Diagnosis not present

## 2019-11-14 DIAGNOSIS — Z794 Long term (current) use of insulin: Secondary | ICD-10-CM | POA: Diagnosis not present

## 2019-11-14 DIAGNOSIS — E119 Type 2 diabetes mellitus without complications: Secondary | ICD-10-CM | POA: Diagnosis not present

## 2020-04-14 DIAGNOSIS — E119 Type 2 diabetes mellitus without complications: Secondary | ICD-10-CM | POA: Diagnosis not present

## 2020-05-22 DIAGNOSIS — I10 Essential (primary) hypertension: Secondary | ICD-10-CM | POA: Diagnosis not present

## 2020-05-22 DIAGNOSIS — E119 Type 2 diabetes mellitus without complications: Secondary | ICD-10-CM | POA: Diagnosis not present

## 2020-05-22 DIAGNOSIS — Z720 Tobacco use: Secondary | ICD-10-CM | POA: Diagnosis not present

## 2020-05-22 DIAGNOSIS — Z6841 Body Mass Index (BMI) 40.0 and over, adult: Secondary | ICD-10-CM | POA: Diagnosis not present

## 2020-05-22 DIAGNOSIS — E7800 Pure hypercholesterolemia, unspecified: Secondary | ICD-10-CM | POA: Diagnosis not present

## 2020-05-22 DIAGNOSIS — R69 Illness, unspecified: Secondary | ICD-10-CM | POA: Diagnosis not present

## 2020-05-22 DIAGNOSIS — Z0000 Encounter for general adult medical examination without abnormal findings: Secondary | ICD-10-CM | POA: Diagnosis not present

## 2020-05-22 DIAGNOSIS — Z1211 Encounter for screening for malignant neoplasm of colon: Secondary | ICD-10-CM | POA: Diagnosis not present

## 2020-11-24 DIAGNOSIS — E7800 Pure hypercholesterolemia, unspecified: Secondary | ICD-10-CM | POA: Diagnosis not present

## 2020-11-24 DIAGNOSIS — Z794 Long term (current) use of insulin: Secondary | ICD-10-CM | POA: Diagnosis not present

## 2020-11-24 DIAGNOSIS — Z720 Tobacco use: Secondary | ICD-10-CM | POA: Diagnosis not present

## 2020-11-24 DIAGNOSIS — E1169 Type 2 diabetes mellitus with other specified complication: Secondary | ICD-10-CM | POA: Diagnosis not present

## 2020-11-24 DIAGNOSIS — I10 Essential (primary) hypertension: Secondary | ICD-10-CM | POA: Diagnosis not present

## 2020-11-24 DIAGNOSIS — R69 Illness, unspecified: Secondary | ICD-10-CM | POA: Diagnosis not present

## 2020-11-24 DIAGNOSIS — R928 Other abnormal and inconclusive findings on diagnostic imaging of breast: Secondary | ICD-10-CM | POA: Diagnosis not present

## 2020-11-24 DIAGNOSIS — Z6841 Body Mass Index (BMI) 40.0 and over, adult: Secondary | ICD-10-CM | POA: Diagnosis not present

## 2020-11-24 DIAGNOSIS — Z1211 Encounter for screening for malignant neoplasm of colon: Secondary | ICD-10-CM | POA: Diagnosis not present

## 2020-12-08 ENCOUNTER — Encounter (INDEPENDENT_AMBULATORY_CARE_PROVIDER_SITE_OTHER): Payer: No Typology Code available for payment source | Admitting: Ophthalmology

## 2020-12-08 DIAGNOSIS — Z1212 Encounter for screening for malignant neoplasm of rectum: Secondary | ICD-10-CM | POA: Diagnosis not present

## 2020-12-08 DIAGNOSIS — Z1211 Encounter for screening for malignant neoplasm of colon: Secondary | ICD-10-CM | POA: Diagnosis not present

## 2021-04-28 DIAGNOSIS — Z0000 Encounter for general adult medical examination without abnormal findings: Secondary | ICD-10-CM | POA: Diagnosis not present

## 2021-04-28 DIAGNOSIS — R69 Illness, unspecified: Secondary | ICD-10-CM | POA: Diagnosis not present

## 2021-04-28 DIAGNOSIS — E1140 Type 2 diabetes mellitus with diabetic neuropathy, unspecified: Secondary | ICD-10-CM | POA: Diagnosis not present

## 2021-04-28 DIAGNOSIS — I10 Essential (primary) hypertension: Secondary | ICD-10-CM | POA: Diagnosis not present

## 2021-04-28 DIAGNOSIS — E7800 Pure hypercholesterolemia, unspecified: Secondary | ICD-10-CM | POA: Diagnosis not present

## 2021-04-28 DIAGNOSIS — Z1211 Encounter for screening for malignant neoplasm of colon: Secondary | ICD-10-CM | POA: Diagnosis not present
